# Patient Record
Sex: Male | Born: 1960 | Race: Black or African American | Hispanic: No | Marital: Married
Health system: Southern US, Community
[De-identification: ages and names within clinical notes are randomized; demographics above are authoritative.]

---

## 2015-08-16 ENCOUNTER — Telehealth: Payer: Self-pay

## 2015-08-16 DIAGNOSIS — B20 Human immunodeficiency virus [HIV] disease: Secondary | ICD-10-CM | POA: Insufficient documentation

## 2015-08-16 NOTE — Telephone Encounter (Signed)
Social worker calling to schedule ID visit while patient is in office.  He does not speak [place] and will need an interpreter.  [facility]. He is HIV positive and taking medications.  He will need a intake.  Patient's spouse scheduled on same day for PreP.   Laurell Josephsammy K King, RN

## 2015-08-17 ENCOUNTER — Ambulatory Visit
Admission: RE | Admit: 2015-08-17 | Discharge: 2015-08-17 | Disposition: A | Discharge status: Home / Self Care | Payer: No Typology Code available for payment source | Source: Ambulatory Visit | Attending: Infectious Disease | Admitting: Infectious Disease

## 2015-08-17 ENCOUNTER — Other Ambulatory Visit: Payer: Self-pay | Admitting: Infectious Disease

## 2015-08-17 DIAGNOSIS — Z111 Encounter for screening for respiratory tuberculosis: Secondary | ICD-10-CM

## 2015-08-30 ENCOUNTER — Other Ambulatory Visit (HOSPITAL_COMMUNITY)
Admission: RE | Admit: 2015-08-30 | Discharge: 2015-08-30 | Disposition: A | Discharge status: Home / Self Care | Payer: Medicaid Other | Source: Ambulatory Visit | Attending: Infectious Diseases | Admitting: Infectious Diseases

## 2015-08-30 ENCOUNTER — Other Ambulatory Visit: Payer: Self-pay

## 2015-08-30 ENCOUNTER — Ambulatory Visit: Payer: Medicaid Other

## 2015-08-30 DIAGNOSIS — Z8619 Personal history of other infectious and parasitic diseases: Secondary | ICD-10-CM

## 2015-08-30 DIAGNOSIS — Z113 Encounter for screening for infections with a predominantly sexual mode of transmission: Secondary | ICD-10-CM | POA: Diagnosis not present

## 2015-08-30 DIAGNOSIS — Z202 Contact with and (suspected) exposure to infections with a predominantly sexual mode of transmission: Secondary | ICD-10-CM

## 2015-08-30 DIAGNOSIS — B20 Human immunodeficiency virus [HIV] disease: Secondary | ICD-10-CM

## 2015-08-30 DIAGNOSIS — R768 Other specified abnormal immunological findings in serum: Secondary | ICD-10-CM

## 2015-08-30 DIAGNOSIS — I10 Essential (primary) hypertension: Secondary | ICD-10-CM

## 2015-08-30 DIAGNOSIS — E785 Hyperlipidemia, unspecified: Secondary | ICD-10-CM

## 2015-08-30 DIAGNOSIS — A159 Respiratory tuberculosis unspecified: Secondary | ICD-10-CM

## 2015-08-30 DIAGNOSIS — A150 Tuberculosis of lung: Secondary | ICD-10-CM

## 2015-08-30 LAB — COMPLETE METABOLIC PANEL WITH GFR
ALT: 24 U/L (ref 9–46)
AST: 17 U/L (ref 10–35)
Albumin: 4.2 g/dL (ref 3.6–5.1)
Alkaline Phosphatase: 149 U/L — ABNORMAL HIGH (ref 40–115)
BILIRUBIN TOTAL: 0.2 mg/dL (ref 0.2–1.2)
BUN: 11 mg/dL (ref 7–25)
CO2: 23 mmol/L (ref 20–31)
CREATININE: 0.86 mg/dL (ref 0.70–1.33)
Calcium: 9.2 mg/dL (ref 8.6–10.3)
Chloride: 101 mmol/L (ref 98–110)
GFR, Est African American: >89 mL/min (ref >=60)
GFR, Est Non African American: >89 mL/min (ref >=60)
GLUCOSE: 128 mg/dL — AB (ref 65–99)
Potassium: 4.4 mmol/L (ref 3.5–5.3)
SODIUM: 135 mmol/L (ref 135–146)
TOTAL PROTEIN: 7.1 g/dL (ref 6.1–8.1)

## 2015-08-30 LAB — CBC WITH DIFFERENTIAL/PLATELET
BASOS PCT: 0 %
Basophils Absolute: 0 cells/uL (ref 0–200)
EOS ABS: 354 {cells}/uL (ref 15–500)
EOS PCT: 6 %
HCT: 46.9 % (ref 38.5–50.0)
Hemoglobin: 15.7 g/dL (ref 13.2–17.1)
Lymphocytes Relative: 22 %
Lymphs Abs: 1298 cells/uL (ref 850–3900)
MCH: 31.8 pg (ref 27.0–33.0)
MCHC: 33.5 g/dL (ref 32.0–36.0)
MCV: 95.1 fL (ref 80.0–100.0)
MONOS PCT: 8 %
MPV: 8.2 fL (ref 7.5–12.5)
Monocytes Absolute: 472 cells/uL (ref 200–950)
NEUTROS ABS: 3776 {cells}/uL (ref 1500–7800)
Neutrophils Relative %: 64 %
PLATELETS: 332 10*3/uL (ref 140–400)
RBC: 4.93 MIL/uL (ref 4.20–5.80)
RDW: 13.2 % (ref 11.0–15.0)
WBC: 5.9 10*3/uL (ref 3.8–10.8)

## 2015-08-30 LAB — LIPID PANEL
Cholesterol: 159 mg/dL (ref 125–200)
HDL: 45 mg/dL (ref >=40)
LDL CALC: 65 mg/dL (ref <130)
Total CHOL/HDL Ratio: 3.5 Ratio (ref <=5.0)
Triglycerides: 244 mg/dL — ABNORMAL HIGH (ref <150)
VLDL: 49 mg/dL — ABNORMAL HIGH (ref <30)

## 2015-08-30 MED ORDER — EFAVIRENZ-EMTRICITAB-TENOFOVIR 600-200-300 MG PO TABS: 1.0000 | ORAL_TABLET | Freq: Every day | ORAL | 0 refills | Status: AC

## 2015-08-30 MED ORDER — SULFAMETHOXAZOLE-TRIMETHOPRIM 800-160 MG PO TABS: 1.0000 | ORAL_TABLET | Freq: Two times a day (BID) | ORAL | 0 refills | Status: AC

## 2015-08-31 DIAGNOSIS — R768 Other specified abnormal immunological findings in serum: Secondary | ICD-10-CM | POA: Insufficient documentation

## 2015-08-31 DIAGNOSIS — A150 Tuberculosis of lung: Secondary | ICD-10-CM | POA: Insufficient documentation

## 2015-08-31 DIAGNOSIS — Z8619 Personal history of other infectious and parasitic diseases: Secondary | ICD-10-CM | POA: Insufficient documentation

## 2015-08-31 DIAGNOSIS — I10 Essential (primary) hypertension: Secondary | ICD-10-CM | POA: Insufficient documentation

## 2015-08-31 LAB — URINALYSIS
Bilirubin Urine: NEGATIVE
GLUCOSE, UA: NEGATIVE
KETONES UR: NEGATIVE
Leukocytes, UA: NEGATIVE
NITRITE: NEGATIVE
PH: 5 (ref 5.0–8.0)
SPECIFIC GRAVITY, URINE: 1.028 (ref 1.001–1.035)

## 2015-08-31 LAB — HEPATITIS B SURFACE ANTIBODY,QUALITATIVE: Hep B S Ab: POSITIVE — AB

## 2015-08-31 LAB — HEPATITIS B CORE ANTIBODY, TOTAL: HEP B C TOTAL AB: REACTIVE — AB

## 2015-08-31 LAB — T-HELPER CELL (CD4) - (RCID CLINIC ONLY)
CD4 T CELL HELPER: 35 % (ref 33–55)
CD4 T Cell Abs: 490 /uL (ref 400–2700)

## 2015-08-31 LAB — RPR

## 2015-08-31 LAB — HEPATITIS A ANTIBODY, TOTAL: Hep A Total Ab: REACTIVE — AB

## 2015-08-31 LAB — HEPATITIS C ANTIBODY: HCV AB: NEGATIVE

## 2015-08-31 LAB — HEPATITIS B SURFACE ANTIGEN: HEP B S AG: NEGATIVE

## 2015-08-31 NOTE — Progress Notes (Signed)
Patient was referred by [facility] during refugee physical.  He is from [place] and speaks [place].  Interpreter present. Vern Claude) with [employer].   The interpreter states the patient is  not very forth coming with answers to questions and refused to be specific regarding HIV care. He had two white pills in his pocket and states this is his HIV regimen.  Our  pharmacist has identified medications as Bactrim and HIV medication similar to Atripla.   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 Patient has a documented history of syphilis and treatment in January 2017.  [facility] is referring patient's wife for pre-exposure prophylaxis . She has been scheduled on the same day of return with physician for this patient.  Copy of immigration physical sent by [facility].  Patient has refused to answer questions so labs will be obtained and he will be released.  He is not willing to complete labs because he thinks he completed required labs with health department at refugee physical.  He was finally convinced labs were necessary.  He has an adult  daughter who speaks [place], Maralyn Sago . The patient is not aware she knows about his HIV diagnosis and she pretends not to know.  He has told me several times he daughter is not to know about his HIV because she has seizure disorder and may become ill if she finds out.   Vaccine history present with medical records.    Interpreter : Roylene Reason  Upon exiting conversation with patient's daughter Maralyn Sago she stated they are leaving for [place] next week. I asked if the health department was aware and she said no. She had several questions regarding medication refills and was informed we can not call medications to [place] and technically he has not established with physician. She then stated she was not sure if it would be next week. I asked that she call our office to cancel appointments is they leave the state.  Unfortantly the  Atripla was approved by pharmacist and called to pharmacy for a 30 day supply.  [facility] of intentions to move out of state since they were to begin TB treatment next week.   Laurell Josephs, RN

## 2015-09-01 LAB — QUANTIFERON TB GOLD ASSAY (BLOOD)
INTERFERON GAMMA RELEASE ASSAY: NEGATIVE
Mitogen-Nil: 3.27 IU/mL
QUANTIFERON NIL VALUE: 0.02 [IU]/mL
QUANTIFERON TB AG MINUS NIL: 0 [IU]/mL

## 2015-09-03 LAB — HIV-1 RNA ULTRAQUANT REFLEX TO GENTYP+
HIV 1 RNA Quant: <20 copies/mL (ref <20)
HIV-1 RNA Quant, Log: <1.3 Log copies/mL (ref <1.30)

## 2015-09-03 LAB — URINE CYTOLOGY ANCILLARY ONLY
Chlamydia: NEGATIVE
Neisseria Gonorrhea: NEGATIVE

## 2015-09-12 ENCOUNTER — Telehealth: Payer: Self-pay

## 2015-09-12 NOTE — Telephone Encounter (Signed)
Called patient's daughter to see if they are moving to [place] prior to 09-18-15 appointment. She is not sure at this time.  I have advised her to call our office on Friday with a confirmation or her appointment will be cancelled.  I explained we are not able to hold two new patient slots without guarantee they will be arriving.   If she does not call on Friday, September 14, 2015 I will cancel the slot for this patient and his wife on 09-18-15 with Dr Orvan Falconerampbell.   Laurell Josephsammy K King, RN

## 2015-09-13 LAB — HLA B*5701: HLA-B 5701 W/RFLX HLA-B HIGH: NEGATIVE

## 2015-09-18 ENCOUNTER — Ambulatory Visit: Payer: Self-pay | Admitting: Internal Medicine

## 2015-09-25 NOTE — Telephone Encounter (Signed)
Patient was a no show for office visit with Dr Orvan Falconerampbell. I have contacted Health Department.   Laurell Josephsammy K King, RN

## 2015-09-29 ENCOUNTER — Other Ambulatory Visit: Payer: Self-pay | Admitting: Internal Medicine

## 2015-09-29 DIAGNOSIS — B20 Human immunodeficiency virus [HIV] disease: Secondary | ICD-10-CM

## 2016-12-02 IMAGING — CR DG CHEST 1V
1 series · 1 of 1 positions shown · non-contrast
Comparison: None.

CLINICAL DATA: Positive PPD

EXAM:
CHEST 1 VIEW

[w chest pa]
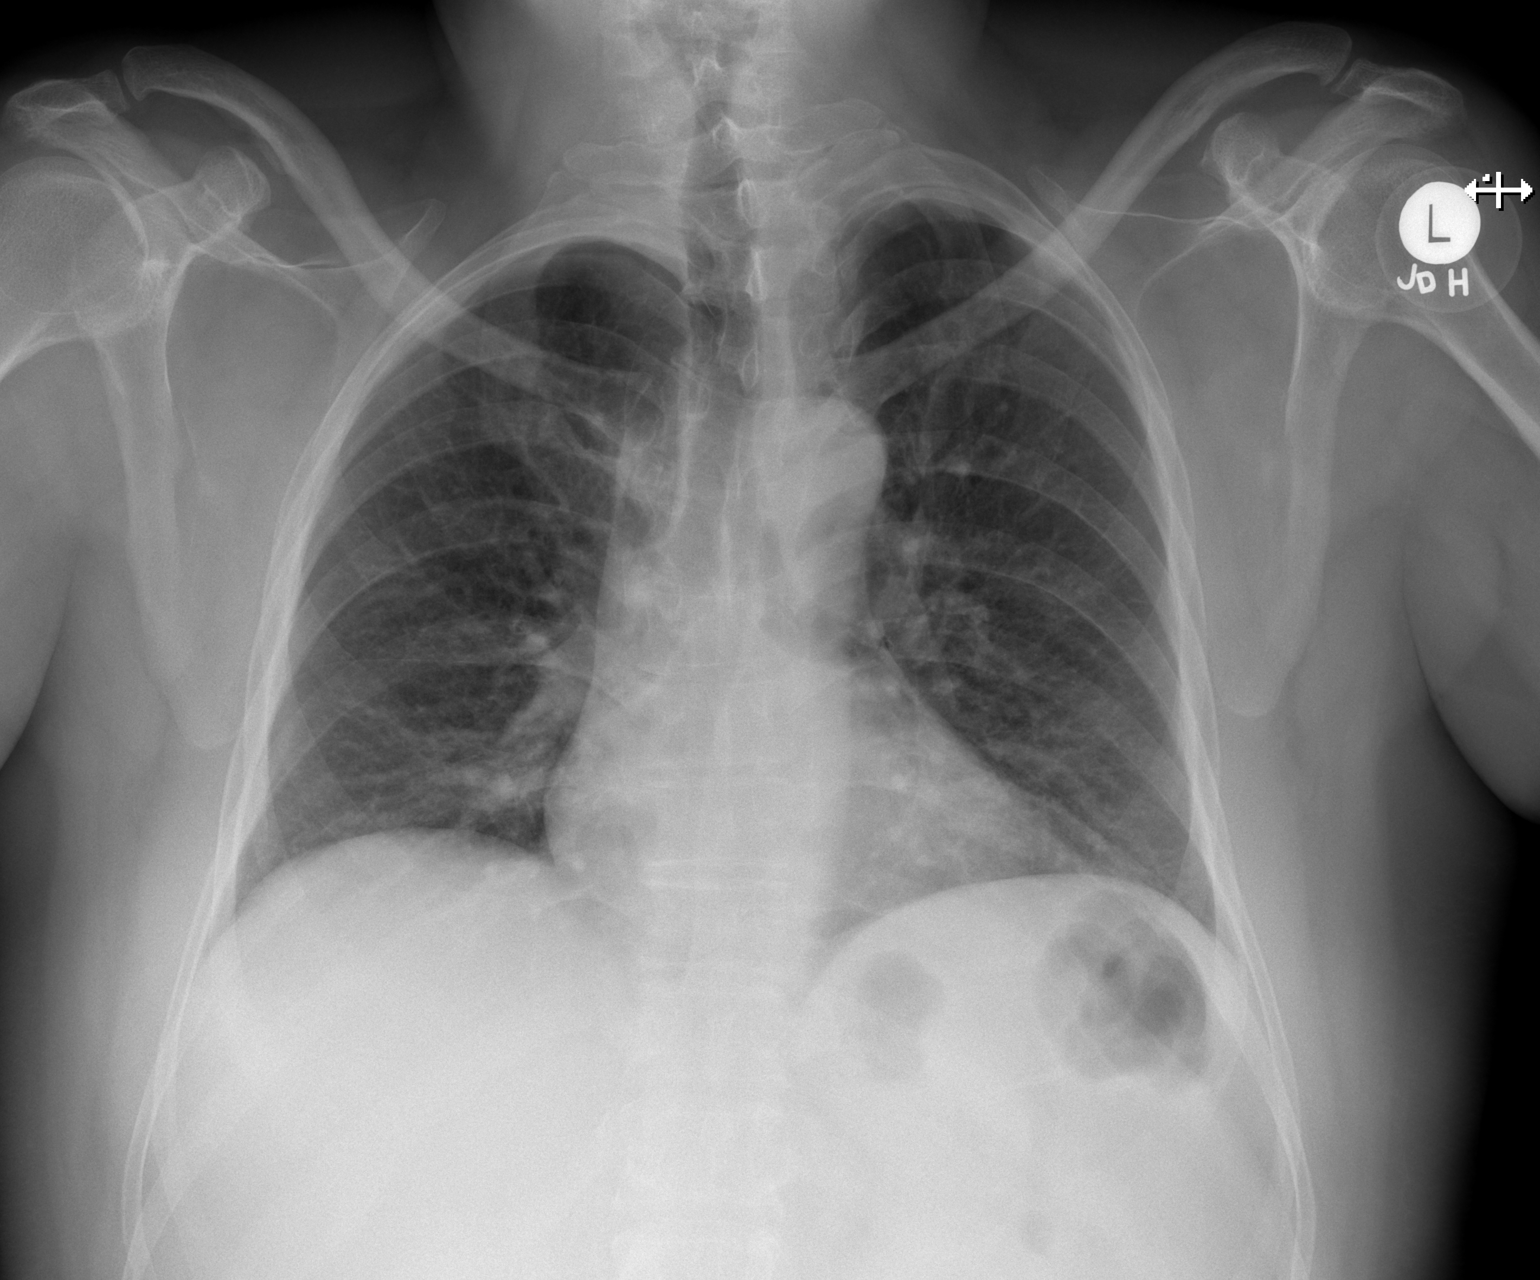

[1 of 1 positions shown; findings below may reference images not displayed]

FINDINGS: The heart size and mediastinal contours are within normal limits.
Both lungs are clear. The visualized skeletal structures are
unremarkable.
IMPRESSION: No active disease.
